# Patient Record
Sex: Female | Born: 1990 | Race: White | Hispanic: No | Marital: Married | Smoking: Never smoker
Health system: Southern US, Community
[De-identification: ages and names within clinical notes are randomized; demographics above are authoritative.]

## PROBLEM LIST (undated history)

## (undated) DIAGNOSIS — Z9889 Other specified postprocedural states: Secondary | ICD-10-CM

## (undated) DIAGNOSIS — F419 Anxiety disorder, unspecified: Secondary | ICD-10-CM

## (undated) DIAGNOSIS — K8020 Calculus of gallbladder without cholecystitis without obstruction: Secondary | ICD-10-CM

## (undated) DIAGNOSIS — R112 Nausea with vomiting, unspecified: Secondary | ICD-10-CM

## (undated) HISTORY — PX: MYRINGOTOMY: SUR874

## (undated) HISTORY — PX: TONSILLECTOMY AND ADENOIDECTOMY: SUR1326

## (undated) HISTORY — PX: OTHER SURGICAL HISTORY: SHX169

## (undated) HISTORY — PX: WISDOM TOOTH EXTRACTION: SHX21

---

## 2020-04-24 ENCOUNTER — Other Ambulatory Visit: Payer: Self-pay

## 2020-04-24 ENCOUNTER — Encounter (HOSPITAL_COMMUNITY): Payer: Self-pay

## 2020-04-24 ENCOUNTER — Emergency Department (HOSPITAL_COMMUNITY): Payer: 59

## 2020-04-24 ENCOUNTER — Emergency Department (HOSPITAL_COMMUNITY)
Admission: EM | Admit: 2020-04-24 | Discharge: 2020-04-25 | Disposition: A | Discharge status: Home / Self Care | Payer: 59 | Attending: Emergency Medicine | Admitting: Emergency Medicine

## 2020-04-24 DIAGNOSIS — R160 Hepatomegaly, not elsewhere classified: Secondary | ICD-10-CM | POA: Insufficient documentation

## 2020-04-24 DIAGNOSIS — K8020 Calculus of gallbladder without cholecystitis without obstruction: Secondary | ICD-10-CM | POA: Insufficient documentation

## 2020-04-24 DIAGNOSIS — R1013 Epigastric pain: Secondary | ICD-10-CM | POA: Diagnosis present

## 2020-04-24 LAB — CBC WITH DIFFERENTIAL/PLATELET
Abs Immature Granulocytes: 0.04 10*3/uL (ref 0.00–0.07)
Basophils Absolute: 0 10*3/uL (ref 0.0–0.1)
Basophils Relative: 0 %
Eosinophils Absolute: 0.1 10*3/uL (ref 0.0–0.5)
Eosinophils Relative: 1 %
HCT: 38.8 % (ref 36.0–46.0)
Hemoglobin: 13.2 g/dL (ref 12.0–15.0)
Immature Granulocytes: 0 %
Lymphocytes Relative: 12 %
Lymphs Abs: 1.6 10*3/uL (ref 0.7–4.0)
MCH: 30.9 pg (ref 26.0–34.0)
MCHC: 34 g/dL (ref 30.0–36.0)
MCV: 90.9 fL (ref 80.0–100.0)
Monocytes Absolute: 0.9 10*3/uL (ref 0.1–1.0)
Monocytes Relative: 6 %
Neutro Abs: 10.9 10*3/uL — ABNORMAL HIGH (ref 1.7–7.7)
Neutrophils Relative %: 81 %
Platelets: 260 10*3/uL (ref 150–400)
RBC: 4.27 MIL/uL (ref 3.87–5.11)
RDW: 12.7 % (ref 11.5–15.5)
WBC: 13.5 10*3/uL — ABNORMAL HIGH (ref 4.0–10.5)
nRBC: 0 % (ref 0.0–0.2)

## 2020-04-24 LAB — COMPREHENSIVE METABOLIC PANEL
ALT: 19 U/L (ref 0–44)
AST: 21 U/L (ref 15–41)
Albumin: 4.7 g/dL (ref 3.5–5.0)
Alkaline Phosphatase: 73 U/L (ref 38–126)
Anion gap: 7 (ref 5–15)
BUN: 8 mg/dL (ref 6–20)
CO2: 24 mmol/L (ref 22–32)
Calcium: 9.6 mg/dL (ref 8.9–10.3)
Chloride: 105 mmol/L (ref 98–111)
Creatinine, Ser: 0.69 mg/dL (ref 0.44–1.00)
GFR, Estimated: >60 mL/min (ref >60)
Glucose, Bld: 111 mg/dL — ABNORMAL HIGH (ref 70–99)
Potassium: 3.9 mmol/L (ref 3.5–5.1)
Sodium: 136 mmol/L (ref 135–145)
Total Bilirubin: 0.2 mg/dL — ABNORMAL LOW (ref 0.3–1.2)
Total Protein: 8.3 g/dL — ABNORMAL HIGH (ref 6.5–8.1)

## 2020-04-24 LAB — LIPASE, BLOOD: Lipase: 41 U/L (ref 11–51)

## 2020-04-24 LAB — TROPONIN I (HIGH SENSITIVITY): Troponin I (High Sensitivity): <2 ng/L (ref <18)

## 2020-04-24 MED ORDER — OXYCODONE-ACETAMINOPHEN 5-325 MG PO TABS
1.0000 | ORAL_TABLET | Freq: Once | ORAL | Status: AC
  Administered 2020-04-24: 1 via ORAL
  Filled 2020-04-24: qty 1

## 2020-04-24 NOTE — ED Triage Notes (Signed)
Pt reports mid upper abdominal pain radiating right under breast since Sunday night after dinner. Pt took medications for indigestion with no improvement.

## 2020-04-24 NOTE — ED Provider Notes (Signed)
[facility]-EMERGENCY DEPT Provider Note   CSN: [id number] Arrival date & time: 04/24/20  2045     History Chief Complaint  Patient presents with  . Abdominal Pain    Deborah Schwartz is a 30 y.o. female.  Patient presents for evaluation of epigastric pain that started 2 days ago. She noticed it first after eating Easter dinner and notes it has been waxing and waning since onset. She cannot identify any modifying factors. She has no vomiting but reports infrequent nausea. The pain radiates to the RUQ and around to her right back. No diarrhea, fever, SOB, cough.  The history is provided by the patient. No language interpreter was used.  Abdominal Pain Associated symptoms: nausea   Associated symptoms: no chest pain, no cough, no dysuria, no fever and no vomiting        History reviewed. No pertinent past medical history.  There are no problems to display for this patient.   History reviewed. No pertinent surgical history.   OB History   No obstetric history on file.     No family history on file.  Social History   Tobacco Use  . Smoking status: Never Smoker  . Smokeless tobacco: Never Used    Home Medications Prior to Admission medications   Not on File    Allergies    Patient has no known allergies.  Review of Systems   Review of Systems  Constitutional: Negative for fever.  Respiratory: Negative for cough.   Cardiovascular: Negative for chest pain.  Gastrointestinal: Positive for abdominal pain and nausea. Negative for vomiting.  Genitourinary: Negative.  Negative for dysuria.    Physical Exam Updated Vital Signs BP (!) 143/82 (BP Location: Left Arm)   Pulse 98   Temp 99.6 F (37.6 C) (Oral)   Resp 18   Ht 5' 2.5" (1.588 m)   Wt 81.6 kg   LMP  (LMP Unknown)   SpO2 98%   BMI 32.40 kg/m   Physical Exam Vitals and nursing note reviewed.  Constitutional:      Appearance: She is well-developed.  Pulmonary:     Effort:  Pulmonary effort is normal.  Abdominal:     General: There is no distension.     Palpations: Abdomen is soft.     Comments: Mild RUQ tenderness.  Musculoskeletal:     Cervical back: Normal range of motion.  Skin:    General: Skin is warm and dry.  Neurological:     Mental Status: She is alert and oriented to person, place, and time.     ED Results / Procedures / Treatments   Labs (all labs ordered are listed, but only abnormal results are displayed) Labs Reviewed  COMPREHENSIVE METABOLIC PANEL - Abnormal; Notable for the following components:      Result Value   Glucose, Bld 111 (*)    Total Protein 8.3 (*)    Total Bilirubin 0.2 (*)    All other components within normal limits  CBC WITH DIFFERENTIAL/PLATELET - Abnormal; Notable for the following components:   WBC 13.5 (*)    Neutro Abs 10.9 (*)    All other components within normal limits  LIPASE, BLOOD  TROPONIN I (HIGH SENSITIVITY)   Results for orders placed or performed during the hospital encounter of 04/24/20  Comprehensive metabolic panel  Result Value Ref Range   Sodium 136 135 - 145 mmol/L   Potassium 3.9 3.5 - 5.1 mmol/L   Chloride 105 98 - 111 mmol/L  CO2 24 22 - 32 mmol/L   Glucose, Bld 111 (H) 70 - 99 mg/dL   BUN 8 6 - 20 mg/dL   Creatinine, Ser 5.39 0.44 - 1.00 mg/dL   Calcium 9.6 8.9 - 76.7 mg/dL   Total Protein 8.3 (H) 6.5 - 8.1 g/dL   Albumin 4.7 3.5 - 5.0 g/dL   AST 21 15 - 41 U/L   ALT 19 0 - 44 U/L   Alkaline Phosphatase 73 38 - 126 U/L   Total Bilirubin 0.2 (L) 0.3 - 1.2 mg/dL   GFR, Estimated >34 >19 mL/min   Anion gap 7 5 - 15  Lipase, blood  Result Value Ref Range   Lipase 41 11 - 51 U/L  CBC with Differential  Result Value Ref Range   WBC 13.5 (H) 4.0 - 10.5 K/uL   RBC 4.27 3.87 - 5.11 MIL/uL   Hemoglobin 13.2 12.0 - 15.0 g/dL   HCT 37.9 02.4 - 09.7 %   MCV 90.9 80.0 - 100.0 fL   MCH 30.9 26.0 - 34.0 pg   MCHC 34.0 30.0 - 36.0 g/dL   RDW 35.3 29.9 - 24.2 %   Platelets 260 150 -  400 K/uL   nRBC 0.0 0.0 - 0.2 %   Neutrophils Relative % 81 %   Neutro Abs 10.9 (H) 1.7 - 7.7 K/uL   Lymphocytes Relative 12 %   Lymphs Abs 1.6 0.7 - 4.0 K/uL   Monocytes Relative 6 %   Monocytes Absolute 0.9 0.1 - 1.0 K/uL   Eosinophils Relative 1 %   Eosinophils Absolute 0.1 0.0 - 0.5 K/uL   Basophils Relative 0 %   Basophils Absolute 0.0 0.0 - 0.1 K/uL   Immature Granulocytes 0 %   Abs Immature Granulocytes 0.04 0.00 - 0.07 K/uL  Troponin I (High Sensitivity)  Result Value Ref Range   Troponin I (High Sensitivity) <2 <18 ng/L    EKG None  Radiology DG Chest Port 1 View  Result Date: 04/24/2020 CLINICAL DATA:  Chest pain EXAM: PORTABLE CHEST 1 VIEW COMPARISON:  None. FINDINGS: 2120 hours. The lungs are clear without focal pneumonia, edema, pneumothorax or pleural effusion. The cardiopericardial silhouette is within normal limits for size. The visualized bony structures of the thorax show no acute abnormality. IMPRESSION: No active disease. Electronically Signed   By: Kennith Center M.D.   On: 04/24/2020 21:36   US Abdomen Limited  Result Date: 04/24/2020 CLINICAL DATA:  Right upper quadrant pain EXAM: ULTRASOUND ABDOMEN LIMITED RIGHT UPPER QUADRANT COMPARISON:  None. FINDINGS: Gallbladder: Multiple shadowing stones measuring up to 1.1 cm. Normal wall thickness. Negative sonographic Murphy. Gallbladder slightly dilated Common bile duct: Diameter: 2 mm Liver: Hepatic echogenicity within normal limits. I so to slightly hyperechoic mass in the right hepatic lobe measuring 3.3 x 3.1 x 3.4 cm. Portal vein is patent on color Doppler imaging with normal direction of blood flow towards the liver. Other: None. IMPRESSION: 1. Cholelithiasis without sonographic evidence for acute cholecystitis. 2. 3.4 cm indeterminate mass within the right hepatic lobe. When the patient is clinically stable and able to follow directions and hold their breath (preferably as an outpatient) further evaluation with  dedicated abdominal MRI should be considered. Electronically Signed   By: Jasmine Pang M.D.   On: 04/24/2020 23:30   DG Chest Port 1 View  Result Date: 04/24/2020 CLINICAL DATA:  Chest pain EXAM: PORTABLE CHEST 1 VIEW COMPARISON:  None. FINDINGS: 2120 hours. The lungs are clear without focal pneumonia,  edema, pneumothorax or pleural effusion. The cardiopericardial silhouette is within normal limits for size. The visualized bony structures of the thorax show no acute abnormality. IMPRESSION: No active disease. Electronically Signed   By: Kennith Center M.D.   On: 04/24/2020 21:36    Procedures Procedures   Medications Ordered in ED Medications - No data to display  ED Course  I have reviewed the triage vital signs and the nursing notes.  Pertinent labs & imaging results that were available during my care of the patient were reviewed by me and considered in my medical decision making (see chart for details).    MDM Rules/Calculators/A&P                          Patient to ED with epigastric and RUQ abdominal pain x 2 days. No fever. Nausea without vomiting.   She is overall well appearing. VSS, afebrile. Labs essentially unremarkable with exception of mild leukocytosis of 13.7. Exam show mildly tender RUQ and epigastrium.   US shows gall stones without evidence acute cholecystitis. There is also a 3.4 cm mass in the right upper lobe of the liver. Further characterization with MRI is suggested follow up. These results were discussed with the patient.   Will provide referral to surgery, Rx oxycodone (#10) and zofran. Specific symptoms that should prompt return to the ED were also discussed, including fever, severe pain, uncontrolled vomiting.   Final Clinical Impression(s) / ED Diagnoses Final diagnoses:  None   1. Cholelithiasis 2. Liver mass   Rx / DC Orders ED Discharge Orders    None       Danne Harbor 04/25/20 0022    Charlynne Pander, MD 04/25/20 1455

## 2020-04-24 NOTE — ED Triage Notes (Signed)
Emergency Medicine Provider Triage Evaluation Note  Deborah Schwartz , a 30 y.o. female  was evaluated in triage.  Pt complains of epigastric pain rates onto her right lower quadrant and into the middle of her back.  Describes as a knifelike sensation.  She has socially chest pain and shortness of breath, nausea denies vomiting, diarrhea, constipation.  Has no significant abdominal history.  Review of Systems  Positive: Chest pain, shortness of breath, abdominal pain Negative: Vomiting, diarrhea,  Physical Exam  BP (!) 150/98 (BP Location: Right Arm)   Pulse (!) 104   Temp 99.6 F (37.6 C) (Oral)   Resp 18   Ht 5' 2.5" (1.588 m)   Wt 81.6 kg   SpO2 100%   BMI 32.40 kg/m  Gen:   Awake, no distress   HEENT:  Atraumatic  Resp:  Normal effort  Cardiac:  Normal rate  MSK:   Moves extremities without difficulty  Neuro:  Speech clear   Medical Decision Making  Medically screening exam initiated at 9:09 PM.  Appropriate orders placed.  Deborah Schwartz was informed that the remainder of the evaluation will be completed by another provider, this initial triage assessment does not replace that evaluation, and the importance of remaining in the ED until their evaluation is complete.  Clinical Impression  Patient presents with abdominal pain, lab work and imaging ordered, patient further work-up here in the emergency department   Deborah Sage, PA-C 04/24/20 2111

## 2020-04-25 MED ORDER — ONDANSETRON 4 MG PO TBDP: 4.0000 mg | ORAL_TABLET | Freq: Three times a day (TID) | ORAL | 0 refills | Status: AC | PRN

## 2020-04-25 MED ORDER — OXYCODONE-ACETAMINOPHEN 5-325 MG PO TABS: 1.0000 | ORAL_TABLET | Freq: Four times a day (QID) | ORAL | 0 refills | Status: DC | PRN

## 2020-04-25 NOTE — Discharge Instructions (Signed)
As we discussed, your ultrasound shows you have gall stones. Schedule an appointment with general surgery (Dr. Andrey Campanile) for elective gall bladder surgery.   If you develop any high fever, severe/uncontrolled pain, please return to the emergency department for further evaluation.   Please follow up with a primary care physician of your choice for further outpatient evaluation of the liver mass that was seen on the ultrasound.

## 2020-05-07 ENCOUNTER — Ambulatory Visit: Payer: Self-pay | Admitting: General Surgery

## 2020-05-07 ENCOUNTER — Encounter (HOSPITAL_COMMUNITY): Payer: Self-pay

## 2020-05-07 ENCOUNTER — Other Ambulatory Visit: Payer: Self-pay

## 2020-05-07 NOTE — Progress Notes (Signed)
COVID Vaccine Completed: Yes Date COVID Vaccine completed: x2 Has received booster: N/A COVID vaccine manufacturer: Pfizer     Date of COVID positive in last 90 days: No  PCP - Evangeline Dakin P.A. [facility] Internal Medicine Cardiologist -  N/A  Chest x-ray - 04/24/2020 in epic EKG - 04/27/20 in epic Stress Test -  N/A ECHO -  N/A Cardiac Cath -  N/A Pacemaker/ICD device last checked: N/A  Sleep Study -  N/A CPAP -  N/A  Fasting Blood Sugar -  N/A Checks Blood Sugar __ N/A___ times a day  Blood Thinner Instructions:  N/A Aspirin Instructions:  N/A Last Dose:  N/A  Activity level: Able to exercise without symptoms    Anesthesia review:  N/A  Patient denies shortness of breath, fever, cough and chest pain at PAT appointment   Patient verbalized understanding of instructions that were given to them at the PAT appointment. Patient was also instructed that they will need to review over the PAT instructions again at home before surgery.

## 2020-05-10 ENCOUNTER — Other Ambulatory Visit (HOSPITAL_COMMUNITY)
Admission: RE | Admit: 2020-05-10 | Discharge: 2020-05-10 | Disposition: A | Discharge status: Home / Self Care | Payer: 59 | Source: Ambulatory Visit | Attending: General Surgery | Admitting: General Surgery

## 2020-05-10 DIAGNOSIS — Z01812 Encounter for preprocedural laboratory examination: Secondary | ICD-10-CM | POA: Insufficient documentation

## 2020-05-10 DIAGNOSIS — Z20822 Contact with and (suspected) exposure to covid-19: Secondary | ICD-10-CM | POA: Diagnosis not present

## 2020-05-11 LAB — SARS CORONAVIRUS 2 (TAT 6-24 HRS): SARS Coronavirus 2: NEGATIVE

## 2020-05-14 ENCOUNTER — Ambulatory Visit (HOSPITAL_COMMUNITY): Payer: 59 | Admitting: Anesthesiology

## 2020-05-14 ENCOUNTER — Ambulatory Visit (HOSPITAL_COMMUNITY)
Admission: RE | Admit: 2020-05-14 | Discharge: 2020-05-14 | Disposition: A | Discharge status: Home / Self Care | Payer: 59 | Attending: General Surgery | Admitting: General Surgery

## 2020-05-14 ENCOUNTER — Encounter (HOSPITAL_COMMUNITY)
Admission: RE | Disposition: A | Discharge status: Home / Self Care | Payer: Self-pay | Source: Home / Self Care | Attending: General Surgery

## 2020-05-14 ENCOUNTER — Encounter (HOSPITAL_COMMUNITY): Payer: Self-pay | Admitting: General Surgery

## 2020-05-14 DIAGNOSIS — K8010 Calculus of gallbladder with chronic cholecystitis without obstruction: Secondary | ICD-10-CM | POA: Insufficient documentation

## 2020-05-14 DIAGNOSIS — Z79899 Other long term (current) drug therapy: Secondary | ICD-10-CM | POA: Insufficient documentation

## 2020-05-14 HISTORY — DX: Other specified postprocedural states: R11.2

## 2020-05-14 HISTORY — DX: Other specified postprocedural states: Z98.890

## 2020-05-14 HISTORY — DX: Calculus of gallbladder without cholecystitis without obstruction: K80.20

## 2020-05-14 HISTORY — DX: Anxiety disorder, unspecified: F41.9

## 2020-05-14 HISTORY — PX: CHOLECYSTECTOMY: SHX55

## 2020-05-14 LAB — CBC
HCT: 40 % (ref 36.0–46.0)
Hemoglobin: 13.6 g/dL (ref 12.0–15.0)
MCH: 30.4 pg (ref 26.0–34.0)
MCHC: 34 g/dL (ref 30.0–36.0)
MCV: 89.5 fL (ref 80.0–100.0)
Platelets: 225 10*3/uL (ref 150–400)
RBC: 4.47 MIL/uL (ref 3.87–5.11)
RDW: 12.7 % (ref 11.5–15.5)
WBC: 6.7 10*3/uL (ref 4.0–10.5)
nRBC: 0 % (ref 0.0–0.2)

## 2020-05-14 LAB — PREGNANCY, URINE: Preg Test, Ur: NEGATIVE

## 2020-05-14 SURGERY — LAPAROSCOPIC CHOLECYSTECTOMY
Anesthesia: General | Site: Abdomen

## 2020-05-14 MED ORDER — ORAL CARE MOUTH RINSE: 15.0000 mL | Freq: Once | OROMUCOSAL | Status: AC

## 2020-05-14 MED ORDER — ROCURONIUM BROMIDE 10 MG/ML (PF) SYRINGE
PREFILLED_SYRINGE | INTRAVENOUS | Status: AC
  Filled 2020-05-14: qty 10

## 2020-05-14 MED ORDER — DEXAMETHASONE SODIUM PHOSPHATE 10 MG/ML IJ SOLN
INTRAMUSCULAR | Status: AC
  Filled 2020-05-14: qty 1

## 2020-05-14 MED ORDER — ONDANSETRON HCL 4 MG/2ML IJ SOLN
INTRAMUSCULAR | Status: AC
  Filled 2020-05-14: qty 2

## 2020-05-14 MED ORDER — PROPOFOL 10 MG/ML IV BOLUS
INTRAVENOUS | Status: DC | PRN
  Administered 2020-05-14: 200 mg via INTRAVENOUS

## 2020-05-14 MED ORDER — MIDAZOLAM HCL 2 MG/2ML IJ SOLN
INTRAMUSCULAR | Status: AC
  Filled 2020-05-14: qty 2

## 2020-05-14 MED ORDER — PROPOFOL 10 MG/ML IV BOLUS
INTRAVENOUS | Status: AC
  Filled 2020-05-14: qty 20

## 2020-05-14 MED ORDER — ENOXAPARIN SODIUM 40 MG/0.4ML IJ SOSY
40.0000 mg | PREFILLED_SYRINGE | Freq: Once | INTRAMUSCULAR | Status: AC
  Administered 2020-05-14: 40 mg via SUBCUTANEOUS
  Filled 2020-05-14: qty 0.4

## 2020-05-14 MED ORDER — ACETAMINOPHEN 500 MG PO TABS: 1000.0000 mg | ORAL_TABLET | Freq: Once | ORAL | Status: DC

## 2020-05-14 MED ORDER — CEFAZOLIN SODIUM-DEXTROSE 2-4 GM/100ML-% IV SOLN
2.0000 g | INTRAVENOUS | Status: AC
  Administered 2020-05-14: 2 g via INTRAVENOUS
  Filled 2020-05-14: qty 100

## 2020-05-14 MED ORDER — 0.9 % SODIUM CHLORIDE (POUR BTL) OPTIME
TOPICAL | Status: DC | PRN
  Administered 2020-05-14: 1000 mL

## 2020-05-14 MED ORDER — FENTANYL CITRATE (PF) 100 MCG/2ML IJ SOLN
INTRAMUSCULAR | Status: AC
  Filled 2020-05-14: qty 2

## 2020-05-14 MED ORDER — CHLORHEXIDINE GLUCONATE CLOTH 2 % EX PADS: 6.0000 | MEDICATED_PAD | Freq: Once | CUTANEOUS | Status: DC

## 2020-05-14 MED ORDER — CELECOXIB 200 MG PO CAPS
400.0000 mg | ORAL_CAPSULE | ORAL | Status: AC
  Administered 2020-05-14: 400 mg via ORAL
  Filled 2020-05-14: qty 2

## 2020-05-14 MED ORDER — FENTANYL CITRATE (PF) 100 MCG/2ML IJ SOLN
25.0000 ug | INTRAMUSCULAR | Status: DC | PRN
  Administered 2020-05-14 (×2): 50 ug via INTRAVENOUS

## 2020-05-14 MED ORDER — ACETAMINOPHEN 500 MG PO TABS
1000.0000 mg | ORAL_TABLET | ORAL | Status: AC
  Administered 2020-05-14: 1000 mg via ORAL
  Filled 2020-05-14: qty 2

## 2020-05-14 MED ORDER — PROMETHAZINE HCL 25 MG/ML IJ SOLN: 6.2500 mg | INTRAMUSCULAR | Status: DC | PRN

## 2020-05-14 MED ORDER — DEXAMETHASONE SODIUM PHOSPHATE 10 MG/ML IJ SOLN
INTRAMUSCULAR | Status: DC | PRN
  Administered 2020-05-14: 4 mg via INTRAVENOUS

## 2020-05-14 MED ORDER — SCOPOLAMINE 1 MG/3DAYS TD PT72
1.0000 | MEDICATED_PATCH | TRANSDERMAL | Status: DC
  Administered 2020-05-14: 1.5 mg via TRANSDERMAL
  Filled 2020-05-14: qty 1

## 2020-05-14 MED ORDER — SUGAMMADEX SODIUM 200 MG/2ML IV SOLN
INTRAVENOUS | Status: DC | PRN
  Administered 2020-05-14: 200 mg via INTRAVENOUS

## 2020-05-14 MED ORDER — LIDOCAINE 2% (20 MG/ML) 5 ML SYRINGE
INTRAMUSCULAR | Status: DC | PRN
  Administered 2020-05-14: 50 mg via INTRAVENOUS

## 2020-05-14 MED ORDER — LACTATED RINGERS IV SOLN: INTRAVENOUS | Status: DC

## 2020-05-14 MED ORDER — CHLORHEXIDINE GLUCONATE 0.12 % MT SOLN
15.0000 mL | Freq: Once | OROMUCOSAL | Status: AC
  Administered 2020-05-14: 15 mL via OROMUCOSAL

## 2020-05-14 MED ORDER — DROPERIDOL 2.5 MG/ML IJ SOLN: 0.6250 mg | Freq: Once | INTRAMUSCULAR | Status: DC | PRN

## 2020-05-14 MED ORDER — OXYCODONE HCL 5 MG PO TABS: 5.0000 mg | ORAL_TABLET | Freq: Once | ORAL | Status: DC | PRN

## 2020-05-14 MED ORDER — LIDOCAINE 2% (20 MG/ML) 5 ML SYRINGE
INTRAMUSCULAR | Status: AC
  Filled 2020-05-14: qty 5

## 2020-05-14 MED ORDER — OXYCODONE HCL 5 MG/5ML PO SOLN
5.0000 mg | Freq: Once | ORAL | Status: DC | PRN
Start: 2020-05-14 — End: 2020-05-14

## 2020-05-14 MED ORDER — FENTANYL CITRATE (PF) 100 MCG/2ML IJ SOLN
INTRAMUSCULAR | Status: DC | PRN
  Administered 2020-05-14: 50 ug via INTRAVENOUS
  Administered 2020-05-14: 100 ug via INTRAVENOUS
  Administered 2020-05-14: 50 ug via INTRAVENOUS

## 2020-05-14 MED ORDER — MIDAZOLAM HCL 2 MG/2ML IJ SOLN
INTRAMUSCULAR | Status: DC | PRN
  Administered 2020-05-14: 2 mg via INTRAVENOUS

## 2020-05-14 MED ORDER — IBUPROFEN 800 MG PO TABS: 800.0000 mg | ORAL_TABLET | Freq: Three times a day (TID) | ORAL | 0 refills | Status: AC | PRN

## 2020-05-14 MED ORDER — ONDANSETRON HCL 4 MG/2ML IJ SOLN
INTRAMUSCULAR | Status: DC | PRN
  Administered 2020-05-14: 4 mg via INTRAVENOUS

## 2020-05-14 MED ORDER — BUPIVACAINE-EPINEPHRINE 0.25% -1:200000 IJ SOLN
INTRAMUSCULAR | Status: AC
  Filled 2020-05-14: qty 1

## 2020-05-14 MED ORDER — BUPIVACAINE-EPINEPHRINE 0.25% -1:200000 IJ SOLN
INTRAMUSCULAR | Status: DC | PRN
  Administered 2020-05-14: 40 mL

## 2020-05-14 MED ORDER — ROCURONIUM BROMIDE 10 MG/ML (PF) SYRINGE
PREFILLED_SYRINGE | INTRAVENOUS | Status: DC | PRN
  Administered 2020-05-14: 70 mg via INTRAVENOUS

## 2020-05-14 MED ORDER — OXYCODONE HCL 5 MG PO TABS: 5.0000 mg | ORAL_TABLET | Freq: Four times a day (QID) | ORAL | 0 refills | Status: AC | PRN

## 2020-05-14 SURGICAL SUPPLY — 43 items
APPLIER CLIP 5 13 M/L LIGAMAX5 (MISCELLANEOUS)
APPLIER CLIP ROT 10 11.4 M/L (STAPLE)
BENZOIN TINCTURE PRP APPL 2/3 (GAUZE/BANDAGES/DRESSINGS) ×2 IMPLANT
BNDG ADH 1X3 SHEER STRL LF (GAUZE/BANDAGES/DRESSINGS) ×8 IMPLANT
CABLE HIGH FREQUENCY MONO STRZ (ELECTRODE) ×2 IMPLANT
CHLORAPREP W/TINT 26 (MISCELLANEOUS) ×2 IMPLANT
CLIP APPLIE 5 13 M/L LIGAMAX5 (MISCELLANEOUS) IMPLANT
CLIP APPLIE ROT 10 11.4 M/L (STAPLE) IMPLANT
CLIP VESOLOCK LG 6/CT PURPLE (CLIP) IMPLANT
CLIP VESOLOCK MED LG 6/CT (CLIP) ×2 IMPLANT
COVER MAYO STAND STRL (DRAPES) ×2 IMPLANT
COVER SURGICAL LIGHT HANDLE (MISCELLANEOUS) ×2 IMPLANT
COVER WAND RF STERILE (DRAPES) IMPLANT
DECANTER SPIKE VIAL GLASS SM (MISCELLANEOUS) ×2 IMPLANT
DERMABOND ADVANCED (GAUZE/BANDAGES/DRESSINGS) ×1
DERMABOND ADVANCED .7 DNX12 (GAUZE/BANDAGES/DRESSINGS) ×1 IMPLANT
DRAIN CHANNEL 19F RND (DRAIN) IMPLANT
DRAPE C-ARM 42X120 X-RAY (DRAPES) IMPLANT
EVACUATOR SILICONE 100CC (DRAIN) IMPLANT
GLOVE SURG POLYISO LF SZ7 (GLOVE) ×2 IMPLANT
GLOVE SURG UNDER POLY LF SZ7 (GLOVE) ×2 IMPLANT
GOWN STRL REUS W/TWL LRG LVL3 (GOWN DISPOSABLE) ×2 IMPLANT
GOWN STRL REUS W/TWL XL LVL3 (GOWN DISPOSABLE) ×4 IMPLANT
GRASPER SUT TROCAR 14GX15 (MISCELLANEOUS) ×2 IMPLANT
KIT BASIN OR (CUSTOM PROCEDURE TRAY) ×2 IMPLANT
KIT TURNOVER KIT A (KITS) ×2 IMPLANT
POUCH RETRIEVAL ECOSAC 10 (ENDOMECHANICALS) ×1 IMPLANT
POUCH RETRIEVAL ECOSAC 10MM (ENDOMECHANICALS) ×1
SCISSORS LAP 5X35 DISP (ENDOMECHANICALS) ×2 IMPLANT
SET CHOLANGIOGRAPH MIX (MISCELLANEOUS) IMPLANT
SET IRRIG TUBING LAPAROSCOPIC (IRRIGATION / IRRIGATOR) ×2 IMPLANT
SET TUBE SMOKE EVAC HIGH FLOW (TUBING) ×2 IMPLANT
SLEEVE XCEL OPT CAN 5 100 (ENDOMECHANICALS) ×4 IMPLANT
STOPCOCK 4 WAY LG BORE MALE ST (IV SETS) IMPLANT
STRIP CLOSURE SKIN 1/2X4 (GAUZE/BANDAGES/DRESSINGS) ×2 IMPLANT
SUT ETHILON 2 0 PS N (SUTURE) IMPLANT
SUT MNCRL AB 4-0 PS2 18 (SUTURE) ×2 IMPLANT
SUT VICRYL 0 ENDOLOOP (SUTURE) IMPLANT
TOWEL OR 17X26 10 PK STRL BLUE (TOWEL DISPOSABLE) ×2 IMPLANT
TOWEL OR NON WOVEN STRL DISP B (DISPOSABLE) IMPLANT
TRAY LAPAROSCOPIC (CUSTOM PROCEDURE TRAY) ×2 IMPLANT
TROCAR BLADELESS OPT 5 100 (ENDOMECHANICALS) ×2 IMPLANT
TROCAR XCEL NON-BLD 11X100MML (ENDOMECHANICALS) ×2 IMPLANT

## 2020-05-14 NOTE — Transfer of Care (Signed)
Immediate Anesthesia Transfer of Care Note  Patient: Raejean Alton  Procedure(s) Performed: LAPAROSCOPIC CHOLECYSTECTOMY (N/A Abdomen)  Patient Location: PACU  Anesthesia Type:General  Level of Consciousness: awake, alert  and oriented  Airway & Oxygen Therapy: Patient Spontanous Breathing and Patient connected to face mask oxygen  Post-op Assessment: Report given to RN, Post -op Vital signs reviewed and stable and Patient moving all extremities X 4  Post vital signs: Reviewed and stable  Last Vitals:  Vitals Value Taken Time  BP    Temp    Pulse 89 05/14/20 1405  Resp 22 05/14/20 1405  SpO2 100 % 05/14/20 1405  Vitals shown include unvalidated device data.  Last Pain:  Vitals:   05/14/20 1030  TempSrc: Oral  PainSc:       Patients Stated Pain Goal: 2 (05/07/20 0824)  Complications: No complications documented.

## 2020-05-14 NOTE — Anesthesia Procedure Notes (Signed)
Procedure Name: Intubation Date/Time: 05/14/2020 1:05 PM Performed by: Niel Hummer, CRNA Pre-anesthesia Checklist: Patient identified, Emergency Drugs available, Suction available and Patient being monitored Patient Re-evaluated:Patient Re-evaluated prior to induction Oxygen Delivery Method: Circle system utilized Preoxygenation: Pre-oxygenation with 100% oxygen Induction Type: IV induction Ventilation: Mask ventilation without difficulty Laryngoscope Size: Mac and 4 Grade View: Grade I Tube type: Oral Tube size: 7.0 mm Number of attempts: 1 Airway Equipment and Method: Stylet Placement Confirmation: positive ETCO2,  ETT inserted through vocal cords under direct vision and breath sounds checked- equal and bilateral Secured at: 21 cm Tube secured with: Tape Dental Injury: Teeth and Oropharynx as per pre-operative assessment

## 2020-05-14 NOTE — Anesthesia Preprocedure Evaluation (Addendum)
Anesthesia Evaluation  Patient identified by MRN, date of birth, ID band Patient awake    Reviewed: Allergy & Precautions, NPO status , Patient's Chart, lab work & pertinent test results  History of Anesthesia Complications (+) PONV and history of anesthetic complications  Airway Mallampati: II  TM Distance: >3 FB Neck ROM: Full    Dental no notable dental hx.    Pulmonary    Pulmonary exam normal breath sounds clear to auscultation       Cardiovascular Exercise Tolerance: Good negative cardio ROS Normal cardiovascular exam Rhythm:Regular Rate:Normal     Neuro/Psych Anxiety    GI/Hepatic negative GI ROS, Neg liver ROS,   Endo/Other  bmi 34  Renal/GU negative Renal ROS  negative genitourinary   Musculoskeletal negative musculoskeletal ROS (+)   Abdominal   Peds negative pediatric ROS (+)  Hematology negative hematology ROS (+)   Anesthesia Other Findings   Reproductive/Obstetrics                            Anesthesia Physical Anesthesia Plan  ASA: II  Anesthesia Plan: General   Post-op Pain Management:    Induction: Intravenous  PONV Risk Score and Plan: 4 or greater and Scopolamine patch - Pre-op, Treatment may vary due to age or medical condition, Midazolam, Ondansetron and Dexamethasone  Airway Management Planned: Oral ETT  Additional Equipment:   Intra-op Plan:   Post-operative Plan: Extubation in OR  Informed Consent: I have reviewed the patients History and Physical, chart, labs and discussed the procedure including the risks, benefits and alternatives for the proposed anesthesia with the patient or authorized representative who has indicated his/her understanding and acceptance.     Dental advisory given  Plan Discussed with: Anesthesiologist and CRNA  Anesthesia Plan Comments:        Anesthesia Quick Evaluation

## 2020-05-14 NOTE — Op Note (Signed)
PATIENT:  Deborah Schwartz  30 y.o. female  PRE-OPERATIVE DIAGNOSIS:  CHRONIC CHOLECYSTITIS  POST-OPERATIVE DIAGNOSIS:  CHRONIC CHOLECYSTITIS  PROCEDURE:  Procedure(s): LAPAROSCOPIC CHOLECYSTECTOMY   SURGEON:  Surgeon(s): Kaetlin Bullen, De Blanch, MD  ASSISTANT: none  ANESTHESIA:   local and general  Indications for procedure: [name] is a 30 y.o. female with symptoms of Abdominal pain and Nausea and vomiting consistent with gallbladder disease, Confirmed by ultrasound.  Description of procedure: The patient was brought into the operative suite, placed supine. Anesthesia was administered with endotracheal tube. Patient was strapped in place and foot board was secured. All pressure points were offloaded by foam padding. The patient was prepped and draped in the usual sterile fashion.  A periumbilical incision was made and optical entry was used to enter the abdomen. 2 5 mm trocars were placed on in the right lateral space on in the right subcostal space. A 30mm trocar was placed in the subxiphoid space. Marcaine with Epinephrine was infused to the subxiphoid space and lateral upper right abdomen in the transversus abdominis plane. Next the patient was placed in reverse trendelenberg. The gallbladder appearedfull of stones and chronically inflamed.   The gallbladder was retracted cephalad and lateral. The peritoneum was reflected off the infundibulum working lateral to medial. The cystic duct and cystic artery were identified and further dissection revealed a critical view. The cystic duct and cystic artery were doubly clipped and ligated.   The gallbladder was removed off the liver bed with cautery. The Gallbladder was placed in a specimen bag. The gallbladder fossa was irrigated and hemostasis was applied with cautery. The gallbladder was removed via the 49mm trocar. The fascial defect was closed with interrupted 0 vicryl suture via laparoscopic trans-fascial suture passer.  Pneumoperitoneum was removed, all trocar were removed. All incisions were closed with 4-0 monocryl subcuticular stitch. The patient woke from anesthesia and was brought to PACU in stable condition. All counts were correct  Findings: chronic cholecystitis  Specimen: gallbladder  Blood loss: 20 ml  Local anesthesia: 40 ml Marcaine with Epinephrine  Complications: none  PLAN OF CARE: Discharge to home after PACU  PATIENT DISPOSITION:  PACU - hemodynamically stable.  Feliciana Rossetti, M.D. General, Bariatric, & Minimally Invasive Surgery [facility]

## 2020-05-14 NOTE — H&P (Signed)
Deborah Schwartz is an 30 y.o. female.   Chief Complaint: gallstones HPI: 30 yo female with multiple bouts of abdominal pain and nausea. Work up was consistent with cholecystitis. She presents for surgery.  Past Medical History:  Diagnosis Date  . Anxiety   . Gallstones   . PONV (postoperative nausea and vomiting)     Past Surgical History:  Procedure Laterality Date  . MYRINGOTOMY     age 46  . Removal of ear tubes    . TONSILLECTOMY AND ADENOIDECTOMY    . WISDOM TOOTH EXTRACTION      History reviewed. No pertinent family history. Social History:  reports that she has never smoked. She has never used smokeless tobacco. She reports previous alcohol use. She reports that she does not use drugs.  Allergies: No Known Allergies  Medications Prior to Admission  Medication Sig Dispense Refill  . clonazePAM (KLONOPIN) 0.5 MG tablet Take 0.5 mg by mouth at bedtime. As needed    . ibuprofen (ADVIL) 200 MG tablet Take 600-800 mg by mouth every 6 (six) hours as needed for mild pain.    Marland Kitchen omeprazole (PRILOSEC OTC) 20 MG tablet Take 20 mg by mouth daily as needed (heartburn).    Marland Kitchen oxyCODONE-acetaminophen (PERCOCET/ROXICET) 5-325 MG tablet Take 1-2 tablets by mouth every 6 (six) hours as needed for severe pain. 10 tablet 0  . PARoxetine (PAXIL) 10 MG tablet Take 10 mg by mouth daily.    . calcium carbonate (TUMS EX) 750 MG chewable tablet Chew 1 tablet by mouth daily as needed for heartburn.    . ondansetron (ZOFRAN ODT) 4 MG disintegrating tablet Take 1 tablet (4 mg total) by mouth every 8 (eight) hours as needed for nausea or vomiting. 10 tablet 0    Results for orders placed or performed during the hospital encounter of 05/14/20 (from the past 48 hour(s))  Pregnancy, urine per protocol     Status: None   Collection Time: 05/14/20 10:23 AM  Result Value Ref Range   Preg Test, Ur NEGATIVE NEGATIVE    Comment:        THE SENSITIVITY OF THIS METHODOLOGY IS >20 mIU/mL. Performed at [facility], [address], [place], [place] [address]   CBC per protocol     Status: None   Collection Time: 05/14/20 10:50 AM  Result Value Ref Range   WBC 6.7 4.0 - 10.5 K/uL   RBC 4.47 3.87 - 5.11 MIL/uL   Hemoglobin 13.6 12.0 - 15.0 g/dL   HCT 65.6 81.2 - 75.1 %   MCV 89.5 80.0 - 100.0 fL   MCH 30.4 26.0 - 34.0 pg   MCHC 34.0 30.0 - 36.0 g/dL   RDW 70.0 17.4 - 94.4 %   Platelets 225 150 - 400 K/uL   nRBC 0.0 0.0 - 0.2 %    Comment: Performed at [facility], [address] [place], [place] [address]   No results found.  Review of Systems  Constitutional: Negative for chills and fever.  HENT: Negative for hearing loss.   Respiratory: Negative for cough.   Cardiovascular: Negative for chest pain and palpitations.  Gastrointestinal: Positive for abdominal pain and nausea. Negative for vomiting.  Genitourinary: Negative for dysuria and urgency.  Musculoskeletal: Negative for myalgias and neck pain.  Skin: Negative for rash.  Neurological: Negative for dizziness and headaches.  Hematological: Does not bruise/bleed easily.  Psychiatric/Behavioral: Negative for suicidal ideas.    Blood pressure 136/90, pulse 95, temperature  99.4 F (37.4 C), temperature source Oral, resp. rate 18, height 5' 2.5" (1.588 m), weight 85.3 kg, last menstrual period 04/30/2020, SpO2 100 %. Physical Exam Vitals reviewed.  Constitutional:      Appearance: She is well-developed.  HENT:     Head: Normocephalic and atraumatic.  Eyes:     Conjunctiva/sclera: Conjunctivae normal.     Pupils: Pupils are equal, round, and reactive to light.  Cardiovascular:     Rate and Rhythm: Normal rate and regular rhythm.  Pulmonary:     Effort: Pulmonary effort is normal.     Breath sounds: Normal breath sounds.  Abdominal:     General: Bowel sounds are normal. There is no distension.     Palpations: Abdomen is soft.     Tenderness: There is no abdominal tenderness.   Musculoskeletal:        General: Normal range of motion.     Cervical back: Normal range of motion and neck supple.  Skin:    General: Skin is warm and dry.  Neurological:     Mental Status: She is alert and oriented to person, place, and time.  Psychiatric:        Behavior: Behavior normal.      Assessment/Plan 30 yo female with cholecystitis with calculous -lap chole -[facility]AS protocol -outpatient procedure  Rodman Pickle, MD 05/14/2020, 11:08 AM

## 2020-05-15 ENCOUNTER — Encounter (HOSPITAL_COMMUNITY): Payer: Self-pay | Admitting: General Surgery

## 2020-05-15 LAB — SURGICAL PATHOLOGY

## 2020-05-15 NOTE — Anesthesia Postprocedure Evaluation (Signed)
Anesthesia Post Note  Patient: Zamorah Petrovic  Procedure(s) Performed: LAPAROSCOPIC CHOLECYSTECTOMY (N/A Abdomen)     Patient location during evaluation: PACU Anesthesia Type: General Level of consciousness: awake and alert Pain management: pain level controlled Vital Signs Assessment: post-procedure vital signs reviewed and stable Respiratory status: spontaneous breathing, nonlabored ventilation, respiratory function stable and patient connected to nasal cannula oxygen Cardiovascular status: blood pressure returned to baseline and stable Postop Assessment: no apparent nausea or vomiting Anesthetic complications: no   No complications documented.  Last Vitals:  Vitals:   05/14/20 1515 05/14/20 1530  BP: 137/86 137/77  Pulse: 83 78  Resp: 11 16  Temp:  36.6 C  SpO2: 100% 100%    Last Pain:  Vitals:   05/14/20 1530  TempSrc:   PainSc: 2                  Shelton Silvas

## 2020-06-05 ENCOUNTER — Other Ambulatory Visit: Payer: Self-pay

## 2020-06-05 ENCOUNTER — Ambulatory Visit (HOSPITAL_COMMUNITY)
Admission: EM | Admit: 2020-06-05 | Discharge: 2020-06-05 | Disposition: A | Discharge status: Home / Self Care | Payer: 59

## 2020-06-05 ENCOUNTER — Encounter (HOSPITAL_COMMUNITY): Payer: Self-pay | Admitting: Registered Nurse

## 2020-06-05 DIAGNOSIS — T50905A Adverse effect of unspecified drugs, medicaments and biological substances, initial encounter: Secondary | ICD-10-CM | POA: Diagnosis not present

## 2020-06-05 DIAGNOSIS — G2571 Drug induced akathisia: Secondary | ICD-10-CM | POA: Diagnosis not present

## 2020-06-05 NOTE — BH Assessment (Signed)
Patient in lobby Buske is routine . Patient denies SI/ HI / AVH. recently had medication change and having side effects. Patient reports post partum 2 years ago while living in [place]. PCP prescribed Paxil 10 mg and patient stated it worked fine . Discontinued the Paxil 06/2019 . This year had a surgery start feeling anxious again tried Paxil again did not help , PCP started Prozac 10 mg patient start feeling side effects , numbness , tingling and panic attacks . PCP advised to come to [facility] .

## 2020-06-05 NOTE — ED Notes (Signed)
Pt discharged with resources in hand. Verbalized understanding of discharge instructions reviewed on AVS. Safety maintained.

## 2020-06-05 NOTE — ED Provider Notes (Signed)
Behavioral Health Urgent Care Medical Screening Exam  Patient Name: Deborah Schwartz MRN: [id number] Date of Evaluation: 06/05/20 Chief Complaint:   Diagnosis:  Final diagnoses:  Acute medication-induced akathisia    History of Present illness: Deborah Schwartz is a 30 y.o. female patient presented to [facility] as a walk in with complaints of restlessness and worsening anxiety since starting Prozac  Deborah Schwartz, 30 y.o., female patient seen face to face by this provider, consulted with Dr. Earlene Plater; and chart reviewed on 06/05/20.  On evaluation Deborah Schwartz reports worsening anxiety and restlessness since starting Prozac a week ago by her primary doctor. States she has an appointment with doctor tomorrow but wanted to know if there was anything she could take until then.  Discussed akathisia symptoms and stopping Prozac, Instructed to take Klonopin Bid until she sees her doctor tomorrow and other medications that are good for anxiety, and depression.  Understanding voiced.  Patient denies suicidal/self-harm/homicidal ideation, psychosis, and paranoia.  Patient states that she has been referred to [facility] for outpatient psychiatric service medication management and is waiting on phone call with appointment.   During evaluation Deborah Schwartz is sitting upright in chare swing legs in no acute distress.  She is alert, oriented x 4, calm and cooperative.  Her mood is euthymic, but anxious with congruent affect.  She does not appear to be responding to internal/external stimuli or delusional thoughts.  Patient denies suicidal/self-harm/homicidal ideation, psychosis, and paranoia.  Patient answered question appropriately.     Psychiatric Specialty Exam  Presentation  General Appearance:Appropriate for Environment; Casual  Eye Contact:Good  Speech:Clear and Coherent; Normal Rate  Speech Volume:Normal  Handedness:Right   Mood and Affect  Mood:Anxious  Affect:Appropriate;  Congruent   Thought Process  Thought Processes:Coherent  Descriptions of Associations:Intact  Orientation:Full (Time, Place and Person)  Thought Content:WDL    Hallucinations:None  Ideas of Reference:None  Suicidal Thoughts:No  Homicidal Thoughts:No   Sensorium  Memory:Immediate Good; Recent Good; Remote Good  Judgment:Intact  Insight:Good   Executive Functions  Concentration:Good  Attention Span:Good  Recall:Good  Fund of Knowledge:Good  Language:Good   Psychomotor Activity  Psychomotor Activity:No data recorded  Assets  Assets:Communication Skills; Desire for Improvement; Financial Resources/Insurance; Housing; Intimacy; Leisure Time; Resilience; Social Support; Transportation   Sleep  Sleep:Good  Number of hours: No data recorded  Nutritional Assessment (For OBS and FBC admissions only) Has the patient had a weight loss or gain of 10 pounds or more in the last 3 months?: No Has the patient had a decrease in food intake/or appetite?: No Does the patient have dental problems?: No Does the patient have eating habits or behaviors that may be indicators of an eating disorder including binging or inducing vomiting?: No Has the patient recently lost weight without trying?: No Has the patient been eating poorly because of a decreased appetite?: No Malnutrition Screening Tool Score: 0    Physical Exam: Physical Exam Vitals and nursing note reviewed. Exam conducted with a chaperone present.  Constitutional:      General: She is not in acute distress.    Appearance: Normal appearance. She is not ill-appearing.  HENT:     Head: Normocephalic.  Eyes:     Pupils: Pupils are equal, round, and reactive to light.  Cardiovascular:     Rate and Rhythm: Normal rate.  Pulmonary:     Effort: Pulmonary effort is normal.  Musculoskeletal:        General: Normal range of motion.  Cervical back: Normal range of motion.  Skin:    General: Skin is warm and  dry.  Neurological:     Mental Status: She is alert and oriented to person, place, and time.  Psychiatric:        Attention and Perception: Attention and perception normal. She does not perceive auditory or visual hallucinations.        Mood and Affect: Affect normal. Mood is anxious.        Speech: Speech normal.        Behavior: Behavior normal. Behavior is cooperative.        Thought Content: Thought content normal. Thought content is not paranoid or delusional. Thought content does not include homicidal or suicidal ideation.        Cognition and Memory: Cognition normal.        Judgment: Judgment normal.    Review of Systems  Constitutional: Negative.   HENT: Negative.   Eyes: Negative.   Respiratory: Negative.   Cardiovascular: Negative.   Gastrointestinal: Negative.   Genitourinary: Negative.   Musculoskeletal:       Restless legs and hands   Skin: Negative.   Neurological: Negative.   Endo/Heme/Allergies: Negative.   Psychiatric/Behavioral: Negative for hallucinations, memory loss, substance abuse and suicidal ideas. Depression: Stable. The patient is nervous/anxious. The patient does not have insomnia.        Patient recently started on Prozac for one week and is now feeling restless, can't stop moving hands and legs, and a tingling in her scalp.  States she is also prescribed Klonopin 0.5 mg Q hs which she feels has help some.     Blood pressure (!) 150/93, pulse 92, temperature 99.4 F (37.4 C), temperature source Oral, resp. rate 16, SpO2 99 %. There is no height or weight on file to calculate BMI.  Musculoskeletal: Strength & Muscle Tone: within normal limits Gait & Station: normal Patient leans: N/A   [facility] MSE Discharge Disposition for Follow up and Recommendations: Based on my evaluation the patient does not appear to have an emergency medical condition and can be discharged with resources and follow up care in outpatient services for Medication Management and  Individual Therapy    Follow-up Information    Call  [facility], [facility].   Why: In-office and online appointments available Contact information: [address]  [address] [place] [address] [phone]        [facility].   Specialty: Behavioral Health Why: Call to set up appointment for medication management Contact information: [address] [place] [address] [phone]       [facility].   Specialty: Behavioral Health Why: Call to schedule an appointment Contact information: [address] [place] [address] [phone]        [facility] [place] [employer].   Specialty: Pension scheme manager information: [facility] [place] [address] [place] [address] [phone]               Discharge Instructions     Talk with your primary doctor.  Zoloft or Seroquel are a good medication for anxiety and depression.      Hardie Veltre, NP 06/05/2020, 4:22 PM

## 2020-06-05 NOTE — Discharge Instructions (Addendum)
Talk with your primary doctor.  Zoloft or Seroquel are a good medication for anxiety and depression.

## 2020-06-07 ENCOUNTER — Other Ambulatory Visit: Payer: Self-pay | Admitting: Internal Medicine

## 2020-06-07 DIAGNOSIS — R160 Hepatomegaly, not elsewhere classified: Secondary | ICD-10-CM

## 2020-06-16 ENCOUNTER — Other Ambulatory Visit: Payer: Self-pay

## 2020-06-16 ENCOUNTER — Ambulatory Visit
Admission: RE | Admit: 2020-06-16 | Discharge: 2020-06-16 | Disposition: A | Discharge status: Home / Self Care | Payer: 59 | Source: Ambulatory Visit | Attending: Internal Medicine | Admitting: Internal Medicine

## 2020-06-16 DIAGNOSIS — R160 Hepatomegaly, not elsewhere classified: Secondary | ICD-10-CM

## 2020-06-16 MED ORDER — GADOBENATE DIMEGLUMINE 529 MG/ML IV SOLN
15.0000 mL | Freq: Once | INTRAVENOUS | Status: AC | PRN
  Administered 2020-06-16: 15 mL via INTRAVENOUS

## 2020-08-10 ENCOUNTER — Other Ambulatory Visit: Payer: Self-pay

## 2020-08-10 ENCOUNTER — Encounter (HOSPITAL_COMMUNITY): Payer: Self-pay

## 2020-08-10 ENCOUNTER — Emergency Department (HOSPITAL_COMMUNITY)
Admission: EM | Admit: 2020-08-10 | Discharge: 2020-08-10 | Disposition: A | Discharge status: Home / Self Care | Payer: 59 | Attending: Emergency Medicine | Admitting: Emergency Medicine

## 2020-08-10 DIAGNOSIS — R002 Palpitations: Secondary | ICD-10-CM | POA: Insufficient documentation

## 2020-08-10 DIAGNOSIS — T43635A Adverse effect of methylphenidate, initial encounter: Secondary | ICD-10-CM | POA: Diagnosis not present

## 2020-08-10 DIAGNOSIS — T887XXA Unspecified adverse effect of drug or medicament, initial encounter: Secondary | ICD-10-CM

## 2020-08-10 NOTE — ED Provider Notes (Signed)
[facility]-EMERGENCY DEPT Provider Note   CSN: [id number] Arrival date & time: 08/10/20  0850     History Chief Complaint  Patient presents with   Anxiety    Deborah Schwartz is a 30 y.o. female.  HPI     30 y/o F comes in with cc of palpitations, insomnia. Pt reports that on Tuesday she was given ritalin. She felt anxious, restless, had palpitations and haven't slept well. She discussed with her psych - medication was discontinued. She continues to feel unwell, and decided to come here. No clonus/jerking/confusion/fainting/hallucinations.  Past Medical History:  Diagnosis Date   Anxiety    Gallstones    PONV (postoperative nausea and vomiting)     Patient Active Problem List   Diagnosis Date Noted   Acute medication-induced akathisia 06/05/2020    Past Surgical History:  Procedure Laterality Date   CHOLECYSTECTOMY N/A 05/14/2020   Procedure: LAPAROSCOPIC CHOLECYSTECTOMY;  Surgeon: Kinsinger, De Blanch, MD;  Location: [facility] ORS;  Service: General;  Laterality: N/A;   MYRINGOTOMY     age 69   Removal of ear tubes     TONSILLECTOMY AND ADENOIDECTOMY     WISDOM TOOTH EXTRACTION       OB History   No obstetric history on file.     History reviewed. No pertinent family history.  Social History   Tobacco Use   Smoking status: Never   Smokeless tobacco: Never  Vaping Use   Vaping Use: Never used  Substance Use Topics   Alcohol use: Not Currently    Comment: occ   Drug use: Never    Home Medications Prior to Admission medications   Medication Sig Start Date End Date Taking? Authorizing Provider  calcium carbonate (TUMS EX) 750 MG chewable tablet Chew 1 tablet by mouth daily as needed for heartburn.    [provider]  clonazePAM (KLONOPIN) 0.5 MG tablet Take 0.5 mg by mouth at bedtime. As needed    [provider]  ibuprofen (ADVIL) 800 MG tablet Take 1 tablet (800 mg total) by mouth every 8 (eight) hours as needed. 05/14/20    Kinsinger, De Blanch, MD  omeprazole (PRILOSEC OTC) 20 MG tablet Take 20 mg by mouth daily as needed (heartburn).    [provider]  ondansetron (ZOFRAN ODT) 4 MG disintegrating tablet Take 1 tablet (4 mg total) by mouth every 8 (eight) hours as needed for nausea or vomiting. 04/25/20   Elpidio Anis, PA-C  oxyCODONE (OXY IR/ROXICODONE) 5 MG immediate release tablet Take 1 tablet (5 mg total) by mouth every 6 (six) hours as needed for severe pain. 05/14/20   Kinsinger, De Blanch, MD  PARoxetine (PAXIL) 10 MG tablet Take 10 mg by mouth daily.    [provider]    Allergies    Patient has no known allergies.  Review of Systems   Review of Systems  Constitutional:  Positive for activity change.  Respiratory:  Negative for shortness of breath.   Cardiovascular:  Negative for chest pain.  Psychiatric/Behavioral:  Positive for sleep disturbance. Negative for hallucinations. The patient is hyperactive.    Physical Exam Updated Vital Signs BP (!) 145/83 (BP Location: Right Arm)   Pulse 82   Temp 99 F (37.2 C) (Oral)   Resp 18   Ht 5\' 2"  (1.575 m)   Wt 85 kg   SpO2 98%   BMI 34.27 kg/m   Physical Exam Vitals and nursing note reviewed.  Constitutional:  Appearance: She is well-developed.  HENT:     Head: Atraumatic.  Cardiovascular:     Rate and Rhythm: Normal rate.  Pulmonary:     Effort: Pulmonary effort is normal.  Musculoskeletal:     Cervical back: Normal range of motion and neck supple.     Comments: 2+ patellar reflex bilaterally  Skin:    General: Skin is warm and dry.  Neurological:     Mental Status: She is alert and oriented to person, place, and time.  Psychiatric:        Mood and Affect: Mood normal.        Behavior: Behavior normal.    ED Results / Procedures / Treatments   Labs (all labs ordered are listed, but only abnormal results are displayed) Labs Reviewed - No data to display  EKG EKG Interpretation  Date/Time:  Friday  August 10 2020 09:18:53 EDT Ventricular Rate:  85 PR Interval:  114 QRS Duration: 88 QT Interval:  372 QTC Calculation: 442 R Axis:   76 Text Interpretation: Normal sinus rhythm Normal ECG No acute changes No significant change since last tracing Confirmed by Derwood Kaplan ([id number]) on 08/10/2020 9:29:38 AM  Radiology No results found.  Procedures Procedures   Medications Ordered in ED Medications - No data to display  ED Course  I have reviewed the triage vital signs and the nursing notes.  Pertinent labs & imaging results that were available during my care of the patient were reviewed by me and considered in my medical decision making (see chart for details).    MDM Rules/Calculators/A&P                            Pt likely has side effects from ritalin. No serotonin syndrome suspected.  She is not having severe issues like psychoses and doesn't appear manic now. Has a psychiatrist - and she will discuss with them the best approach  Final Clinical Impression(s) / ED Diagnoses Final diagnoses:  Palpitations  Medication side effect    Rx / DC Orders ED Discharge Orders     None        Derwood Kaplan, MD 08/10/20 1010

## 2020-08-10 NOTE — ED Triage Notes (Signed)
Pt states took ritalin Tuesday am and has been having intermittent tachycardia and not herself. Pt has hx of anxiety and takes Buspar and lexapro.

## 2020-08-10 NOTE — Discharge Instructions (Addendum)
We suspect persistent medication side effects. Please discuss the symptoms and how best to navigate through the symptoms with your psychiatrist today.  Return to the ER if the symptoms get worse.

## 2023-03-04 IMAGING — MR MR ABDOMEN WO/W CM
17 series · 48 of 48 positions shown · IV contrast (multihance)
Comparison: Abdominal ultrasound 04/24/2020

CLINICAL DATA: Incidental liver lesion on abdominal ultrasound. No
history of malignancy. Interval cholecystectomy.

EXAM:
MRI ABDOMEN WITHOUT AND WITH CONTRAST
TECHNIQUE: Multiplanar multisequence MR imaging of the abdomen was performed
both before and after the administration of intravenous contrast.
CONTRAST:  15mL MULTIHANCE GADOBENATE DIMEGLUMINE 529 MG/ML IV SOLN

[Series 2: T2 · coronal · 5.0mm · 1.56mm/px · 1 of 36 slices shown (1 of 3)]
[im 1/36]
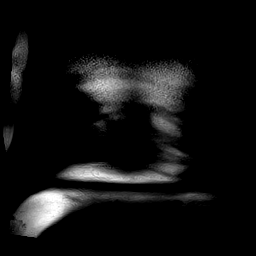

[Series 3: T2 · axial · 5.0mm · 1.48mm/px · 1 of 38 slices shown (2 of 3)]
[im 1/38]
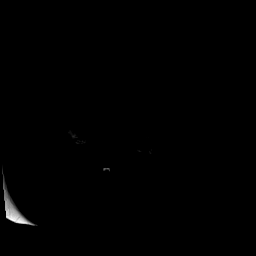

[Series 4: DWI · axial · 5.0mm · 1.42mm/px · z∈[-57,+153]mm · 5 of 108 slices shown (1 of 2)]
[im 1/108]
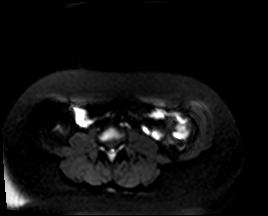
[im 27/108]
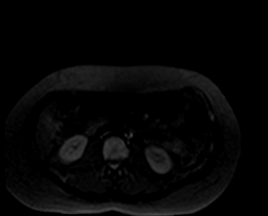
[im 54/108]
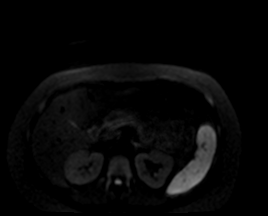
[im 81/108]
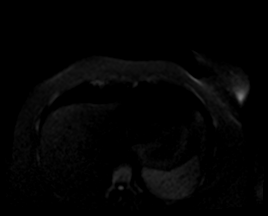
[im 108/108]
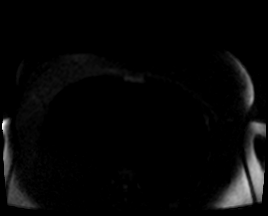

[Series 5: DWI · axial · 5.0mm · 1.42mm/px · z∈[-57,+153]mm · 2 of 36 slices shown (2 of 2)]
[im 1/36]
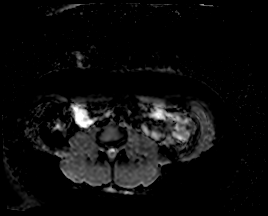
[im 36/36]
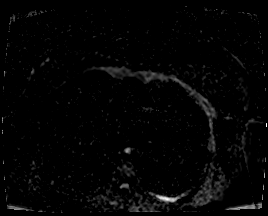

[Series 6: T1 · axial · 3.0mm · 1.19mm/px · z∈[-71,+142]mm · 6 of 144 slices shown]
[im 1/144]
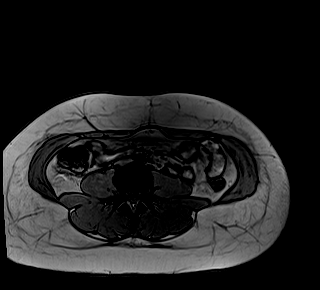
[im 29/144]
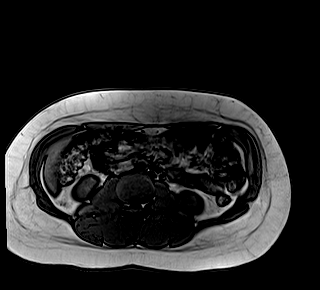
[im 58/144]
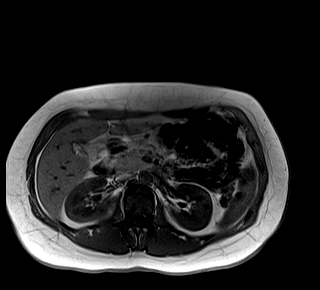
[im 86/144]
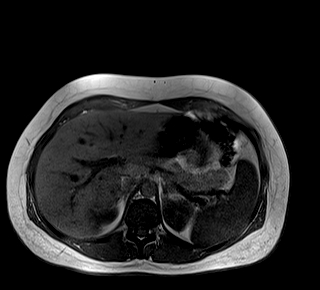
[im 115/144]
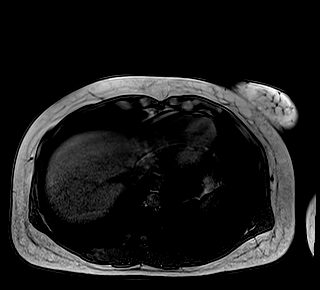
[im 144/144]
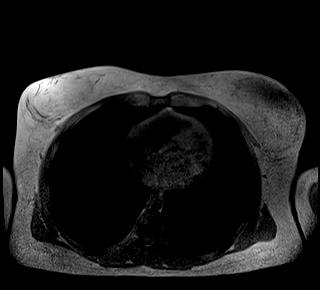

[Series 7: bSSFP · axial · 5.0mm · 1.32mm/px · z∈[-76,+146]mm · 2 of 38 slices shown]
[im 1/38]
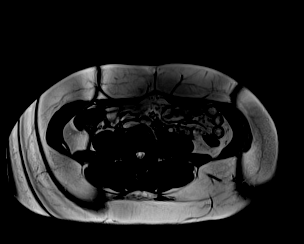
[im 38/38]
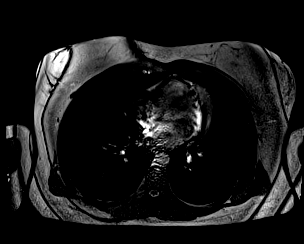

[Series 8: T2 · axial · 6.0mm · 1.19mm/px · 1 of 30 slices shown (3 of 3)]
[im 1/30]
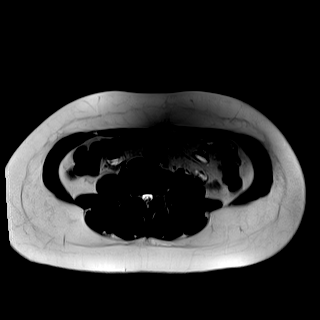

[Series 9: T1 dynamic · axial · non-contrast · 3.0mm · 1.25mm/px · z∈[-71,+142]mm · 3 of 72 slices shown]
[im 1/72]
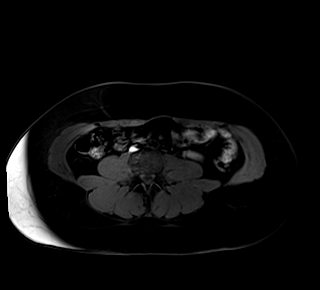
[im 36/72]
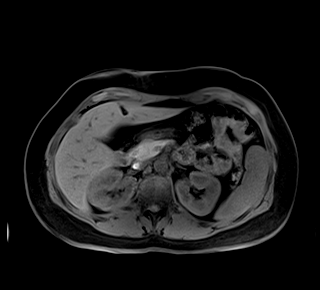
[im 72/72]
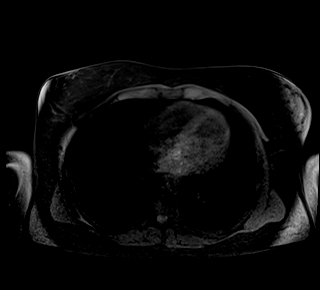

[Series 10: T1 dynamic post-contrast · axial · 3.0mm · 1.25mm/px · z∈[-71,+142]mm · 3 of 72 slices shown (1 of 9)]
[im 1/72]
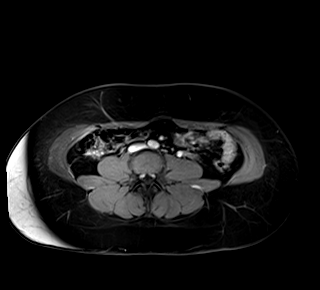
[im 36/72]
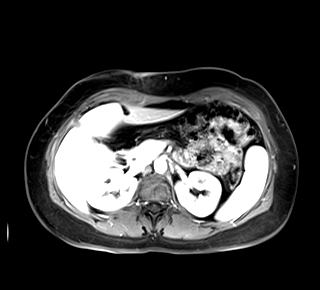
[im 72/72]
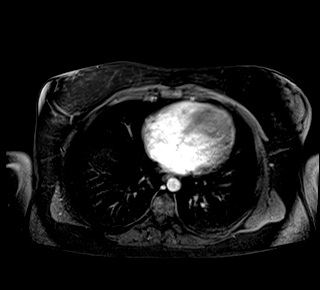

[Series 11: T1 dynamic post-contrast · axial · 3.0mm · 1.25mm/px · z∈[-71,+142]mm · 3 of 72 slices shown (2 of 9)]
[im 1/72]
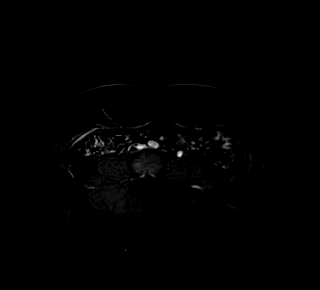
[im 36/72]
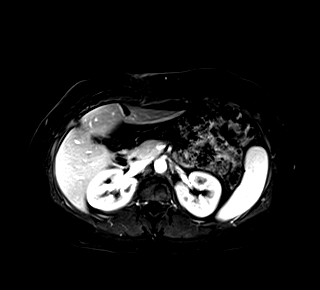
[im 72/72]
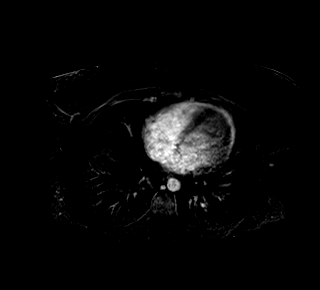

[Series 12: T1 dynamic post-contrast · axial · 3.0mm · 1.25mm/px · z∈[-71,+142]mm · 3 of 72 slices shown (3 of 9)]
[im 1/72]
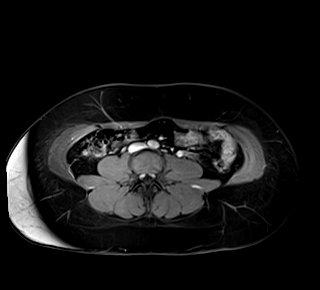
[im 36/72]
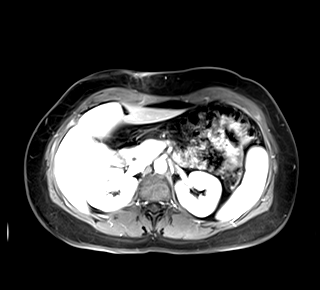
[im 72/72]
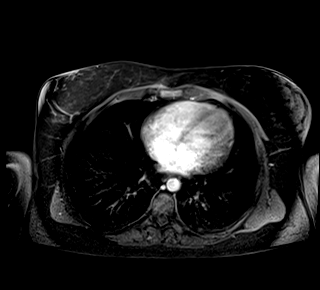

[Series 13: T1 dynamic post-contrast · axial · 3.0mm · 1.25mm/px · z∈[-71,+142]mm · 3 of 72 slices shown (4 of 9)]
[im 1/72]
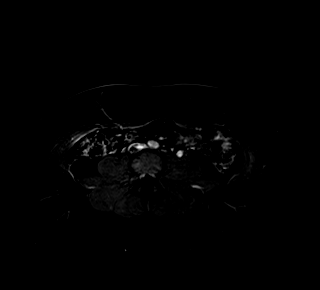
[im 36/72]
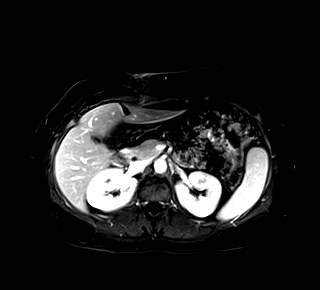
[im 72/72]
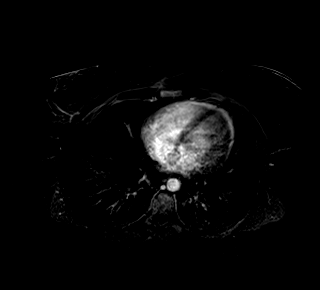

[Series 14: T1 dynamic post-contrast · axial · 3.0mm · 1.25mm/px · z∈[-71,+142]mm · 3 of 72 slices shown (5 of 9)]
[im 1/72]
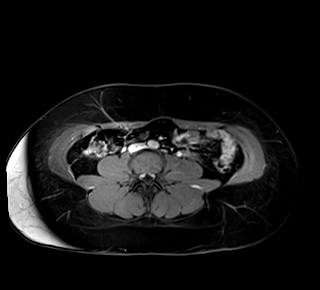
[im 36/72]
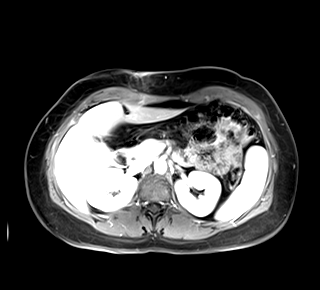
[im 72/72]
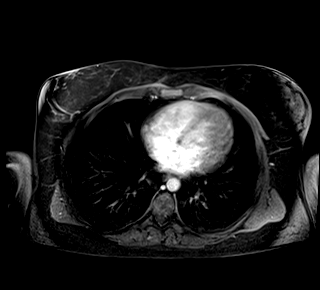

[Series 15: T1 dynamic post-contrast · axial · 3.0mm · 1.25mm/px · z∈[-71,+142]mm · 3 of 72 slices shown (6 of 9)]
[im 1/72]
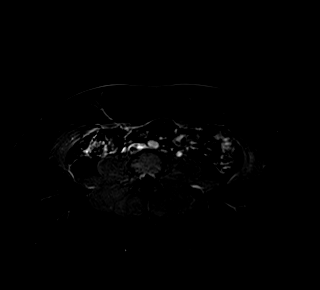
[im 36/72]
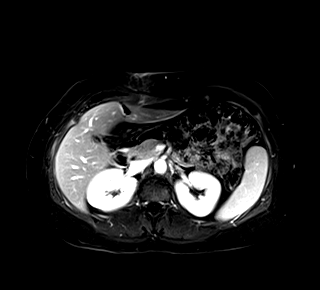
[im 72/72]
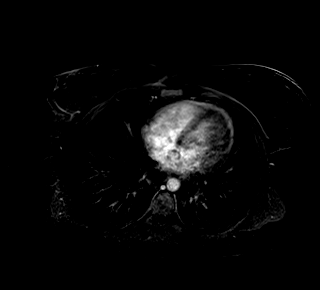

[Series 16: T1 dynamic post-contrast · coronal · 3.0mm · 1.25mm/px · 3 of 72 slices shown (7 of 9)]
[im 1/72]
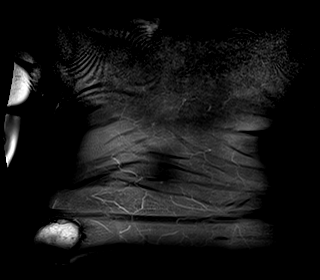
[im 36/72]
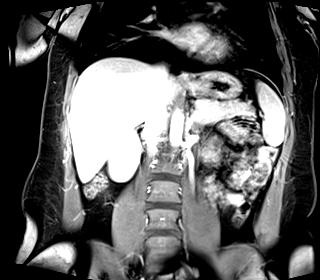
[im 72/72]
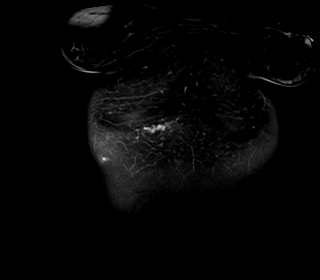

[Series 17: T1 dynamic post-contrast · axial · 3.0mm · 1.25mm/px · z∈[-71,+142]mm · 3 of 72 slices shown (8 of 9)]
[im 1/72]
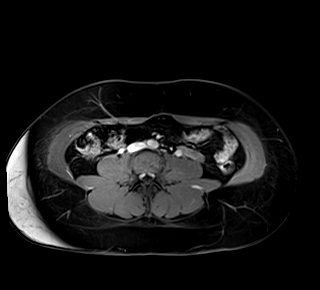
[im 36/72]
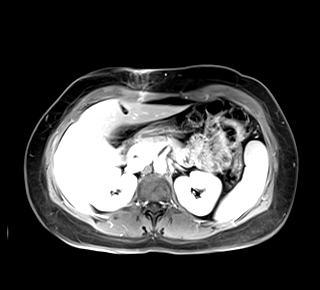
[im 72/72]
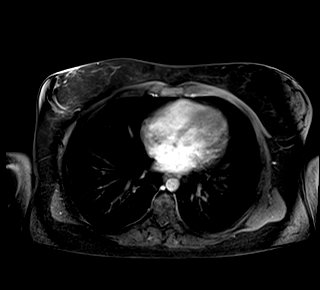

[Series 18: T1 dynamic post-contrast · axial · 3.0mm · 1.25mm/px · z∈[-71,+142]mm · 3 of 72 slices shown (9 of 9)]
[im 1/72]
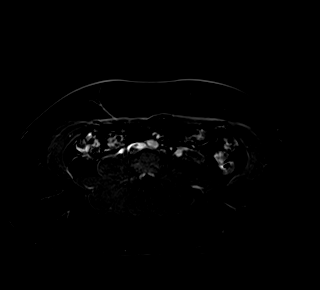
[im 36/72]
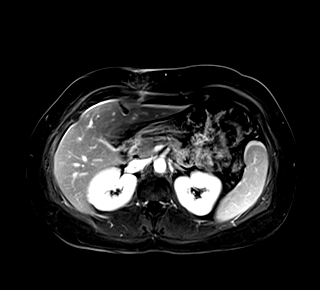
[im 72/72]
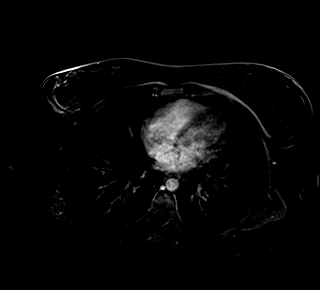

[48 of 48 positions shown; findings below may reference images not displayed]

FINDINGS: Lower chest:  The visualized lower chest appears unremarkable.

Hepatobiliary: No morphologic changes of cirrhosis or significant
steatosis. Corresponding with the lesion on ultrasound is a
well-circumscribed mass centrally in the right hepatic lobe, just
lateral to the IVC. This mass measures 4.0 x 4.8 x 3.8 cm and
demonstrates mild T2 hyperintensity and T1 hypointensity. This
lesion demonstrates fairly homogeneous arterial phase enhancement,
and on delayed imaging the lesion becomes less hyperintense relative
to liver. Within this lesion, there are 2 foci of T2 hyperintensity
which demonstrate delayed enhancement consistent with enhancing
scar. No fat components are seen within the lesion. No other focal
hepatic abnormalities are demonstrated. There is no biliary
dilatation status post interval cholecystectomy.

Pancreas: Unremarkable. No pancreatic ductal dilatation or
surrounding inflammatory changes.

Spleen: Normal in size without focal abnormality.

Adrenals/Urinary Tract: Both adrenal glands appear normal. Both
kidneys appear normal without evidence of mass lesion or
hydronephrosis.

Stomach/Bowel: The stomach appears unremarkable for its degree of
distension. No evidence of bowel wall thickening, distention or
surrounding inflammatory change.

Vascular/Lymphatic: There are no enlarged abdominal lymph nodes. No
significant vascular findings. The portal, superior mesenteric and
splenic veins appear normal.

Other: No ascites.

Musculoskeletal: No acute or significant osseous findings.
IMPRESSION: 1. The lesion previously demonstrated on ultrasound demonstrates MR
features most consistent with focal nodular hyperplasia. Hepatic
adenoma is a less likely consideration given persistent mild
hyperintensity relative to background liver on the delayed
post-contrast images. Recommend follow-up abdominal MRI utilizing
Eovist (more specific for [facility] diagnosis) in 4-6 months.
2. No other a patent lesions or acute findings post interval
cholecystectomy.
